# Patient Record
Sex: Male | Born: 1975 | Race: Black or African American | Hispanic: No | Marital: Married | Smoking: Current every day smoker
Health system: Southern US, Community
[De-identification: ages and names within clinical notes are randomized; demographics above are authoritative.]

## PROBLEM LIST (undated history)

## (undated) DIAGNOSIS — F10988 Alcohol use, unspecified with other alcohol-induced disorder: Secondary | ICD-10-CM

## (undated) DIAGNOSIS — F10929 Alcohol use, unspecified with intoxication, unspecified: Secondary | ICD-10-CM

---

## 2004-02-19 ENCOUNTER — Emergency Department (HOSPITAL_COMMUNITY): Admission: EM | Admit: 2004-02-19 | Discharge: 2004-02-19 | Payer: Self-pay | Admitting: Family Medicine

## 2004-05-14 ENCOUNTER — Ambulatory Visit: Payer: Self-pay | Admitting: Internal Medicine

## 2012-06-11 ENCOUNTER — Emergency Department (HOSPITAL_COMMUNITY)
Admission: EM | Admit: 2012-06-11 | Discharge: 2012-06-11 | Discharge status: Absent without leave | Payer: Medicaid Other | Source: Home / Self Care

## 2012-06-11 ENCOUNTER — Emergency Department (INDEPENDENT_AMBULATORY_CARE_PROVIDER_SITE_OTHER)
Admission: EM | Admit: 2012-06-11 | Discharge: 2012-06-11 | Disposition: A | Discharge status: Home / Self Care | Payer: Medicare Other | Source: Home / Self Care | Attending: Emergency Medicine | Admitting: Emergency Medicine

## 2012-06-11 ENCOUNTER — Encounter (HOSPITAL_COMMUNITY): Payer: Self-pay

## 2012-06-11 ENCOUNTER — Emergency Department (INDEPENDENT_AMBULATORY_CARE_PROVIDER_SITE_OTHER): Payer: Medicaid Other

## 2012-06-11 DIAGNOSIS — Z23 Encounter for immunization: Secondary | ICD-10-CM

## 2012-06-11 DIAGNOSIS — S6992XA Unspecified injury of left wrist, hand and finger(s), initial encounter: Secondary | ICD-10-CM

## 2012-06-11 DIAGNOSIS — S6990XA Unspecified injury of unspecified wrist, hand and finger(s), initial encounter: Secondary | ICD-10-CM

## 2012-06-11 MED ORDER — IBUPROFEN 600 MG PO TABS: 600.0000 mg | ORAL_TABLET | Freq: Two times a day (BID) | ORAL | Status: DC

## 2012-06-11 MED ORDER — TETANUS-DIPHTH-ACELL PERTUSSIS 5-2.5-18.5 LF-MCG/0.5 IM SUSP
0.5000 mL | Freq: Once | INTRAMUSCULAR | Status: AC
  Administered 2012-06-11: 0.5 mL via INTRAMUSCULAR

## 2012-06-11 MED ORDER — IBUPROFEN 600 MG PO TABS
600.0000 mg | ORAL_TABLET | Freq: Once | ORAL | Status: AC
  Administered 2012-06-11: 600 mg via ORAL
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
## 2012-06-12 NOTE — ED Provider Notes (Signed)
Medical screening examination/treatment/procedure(s) were performed by non-physician practitioner and as supervising physician I was immediately available for consultation/collaboration.  Leslee Home, M.D.   Reuben Likes, MD 06/12/12 [phone]

## 2013-07-10 ENCOUNTER — Ambulatory Visit (INDEPENDENT_AMBULATORY_CARE_PROVIDER_SITE_OTHER): Payer: Medicare Other | Admitting: Podiatry

## 2013-07-10 ENCOUNTER — Encounter: Payer: Self-pay | Admitting: Podiatry

## 2013-07-10 VITALS — BP 106/62 | HR 82 | Resp 18 | Ht 68.0 in | Wt 160.0 lb

## 2013-07-10 DIAGNOSIS — B353 Tinea pedis: Secondary | ICD-10-CM

## 2013-07-10 DIAGNOSIS — B351 Tinea unguium: Secondary | ICD-10-CM

## 2013-07-10 MED ORDER — NYSTATIN 100000 UNIT/GM EX CREA: 1.0000 "application " | TOPICAL_CREAM | Freq: Two times a day (BID) | CUTANEOUS | Status: AC

## 2013-07-10 NOTE — Progress Notes (Signed)
Daniel Beck presents today with a care provider with a chief complaint of painful elongated toenails and itching and burning between his toes.  Objective: Pulses are palpable bilateral. Wet maceration between the toes is noted bilateral. Nails are thick yellow dystrophic onychomycotic.  Assessment: Pain in limb secondary to onychomycosis. Interdigital tinea pedis bilateral foot.  Plan: Nystatin cream was written today to be applied on a twice a day basis. Debridement nails 1 through 5 bilateral is cover service secondary to pain. Followup with him in 3 months.

## 2013-07-14 ENCOUNTER — Encounter (HOSPITAL_COMMUNITY): Payer: Self-pay | Admitting: Emergency Medicine

## 2013-07-14 ENCOUNTER — Emergency Department (HOSPITAL_COMMUNITY)
Admission: EM | Admit: 2013-07-14 | Discharge: 2013-07-14 | Discharge status: Absent without leave | Payer: Medicare Other | Source: Home / Self Care

## 2013-07-14 ENCOUNTER — Emergency Department (HOSPITAL_COMMUNITY): Payer: Medicare Other

## 2013-07-14 ENCOUNTER — Emergency Department (HOSPITAL_COMMUNITY)
Admission: EM | Admit: 2013-07-14 | Discharge: 2013-07-14 | Disposition: A | Discharge status: Home / Self Care | Payer: Medicare Other | Attending: Emergency Medicine | Admitting: Emergency Medicine

## 2013-07-14 DIAGNOSIS — Z792 Long term (current) use of antibiotics: Secondary | ICD-10-CM | POA: Insufficient documentation

## 2013-07-14 DIAGNOSIS — S6390XA Sprain of unspecified part of unspecified wrist and hand, initial encounter: Secondary | ICD-10-CM | POA: Insufficient documentation

## 2013-07-14 DIAGNOSIS — F17200 Nicotine dependence, unspecified, uncomplicated: Secondary | ICD-10-CM | POA: Insufficient documentation

## 2013-07-14 DIAGNOSIS — Y939 Activity, unspecified: Secondary | ICD-10-CM | POA: Insufficient documentation

## 2013-07-14 DIAGNOSIS — Y929 Unspecified place or not applicable: Secondary | ICD-10-CM | POA: Insufficient documentation

## 2013-07-14 DIAGNOSIS — IMO0002 Reserved for concepts with insufficient information to code with codable children: Secondary | ICD-10-CM | POA: Insufficient documentation

## 2013-07-14 DIAGNOSIS — Z79899 Other long term (current) drug therapy: Secondary | ICD-10-CM | POA: Insufficient documentation
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
## 2013-07-15 NOTE — ED Provider Notes (Signed)
Medical screening examination/treatment/procedure(s) were performed by non-physician practitioner and as supervising physician I was immediately available for consultation/collaboration.  EKG Interpretation   None         Jakyla Reza, MD 07/15/13 0139 

## 2013-10-09 ENCOUNTER — Encounter: Payer: Self-pay | Admitting: Podiatry

## 2013-10-09 ENCOUNTER — Ambulatory Visit (INDEPENDENT_AMBULATORY_CARE_PROVIDER_SITE_OTHER): Payer: Medicare Other | Admitting: Podiatry

## 2013-10-09 VITALS — BP 117/74 | HR 68 | Resp 16

## 2013-10-09 DIAGNOSIS — M79609 Pain in unspecified limb: Secondary | ICD-10-CM

## 2013-10-09 DIAGNOSIS — B351 Tinea unguium: Secondary | ICD-10-CM

## 2013-10-09 NOTE — Progress Notes (Signed)
Pt states just following up of his toenails. His nails are thick yellow dystrophic onychomycotic and painful he says.  Objective: Pulses are palpable bilateral nails are thick yellow dystrophic with mycotic and painful palpation.  Assessment: Pain in limb secondary to onychomycosis.  Plan: Debridement of nails 1 through 5 bilateral.

## 2014-01-08 ENCOUNTER — Ambulatory Visit (INDEPENDENT_AMBULATORY_CARE_PROVIDER_SITE_OTHER): Payer: Medicare Other | Admitting: Podiatry

## 2014-01-08 ENCOUNTER — Encounter: Payer: Self-pay | Admitting: Podiatry

## 2014-01-08 DIAGNOSIS — M79609 Pain in unspecified limb: Secondary | ICD-10-CM

## 2014-01-08 DIAGNOSIS — B351 Tinea unguium: Secondary | ICD-10-CM

## 2014-01-08 MED ORDER — CLOTRIMAZOLE 1 % EX CREA: 1.0000 "application " | TOPICAL_CREAM | Freq: Two times a day (BID) | CUTANEOUS | Status: AC

## 2014-01-08 NOTE — Progress Notes (Signed)
He presents today with one of his caregivers with a followup of painful toenails and maceration between his toes. He continues apply the Lotrimin cream on an daily to twice a day basis.  Objective: Nails are thick yellow dystrophic onychomycotic and painful palpation. Maceration still exists between the toes but appears to be under control. There is no signs of cellulitis or any bacterial infection.  Assessment: Interdigital tinea pedis bilateral with pain in limb secondary to onychomycosis bilateral.  Plan: Debridement of all reactive hyperkeratotic lesions debridement of nails 1 through 5 bilateral is cover service and refill the Lotrimin cream

## 2014-01-10 ENCOUNTER — Telehealth: Payer: Self-pay | Admitting: *Deleted

## 2014-01-10 MED ORDER — CLINDAMYCIN PHOSPHATE 1 % EX SOLN: Freq: Two times a day (BID) | CUTANEOUS | Status: AC

## 2014-01-10 NOTE — Telephone Encounter (Signed)
Calling in reference to Daniel Beck.  He saw Dr. Al CorpusHyatt on the 5th.  He called in a cream for his feet.  It was discontinued from his MAR.  He was on a liquid.  Pleased call be back so I can tell you what this liquid is so you can put in an order for it.  I returned her call.  She asked if we could call in Clindamycin 1% solution.  [facility]  I told her I would call back with a response.

## 2014-01-10 NOTE — Telephone Encounter (Signed)
That will be fine.   Thank you

## 2014-01-10 NOTE — Telephone Encounter (Signed)
I attempted to call and inform them we e-scribed the Clindamycin to [facility].  There was no answer.

## 2014-03-15 IMAGING — CR DG HAND COMPLETE 3+V*R*
3 series · 3 of 3 positions shown · non-contrast
Comparison: None.

CLINICAL DATA: Right hand pain and swelling

EXAM:
RIGHT HAND - COMPLETE 3+ VIEW

[x hand pa right]
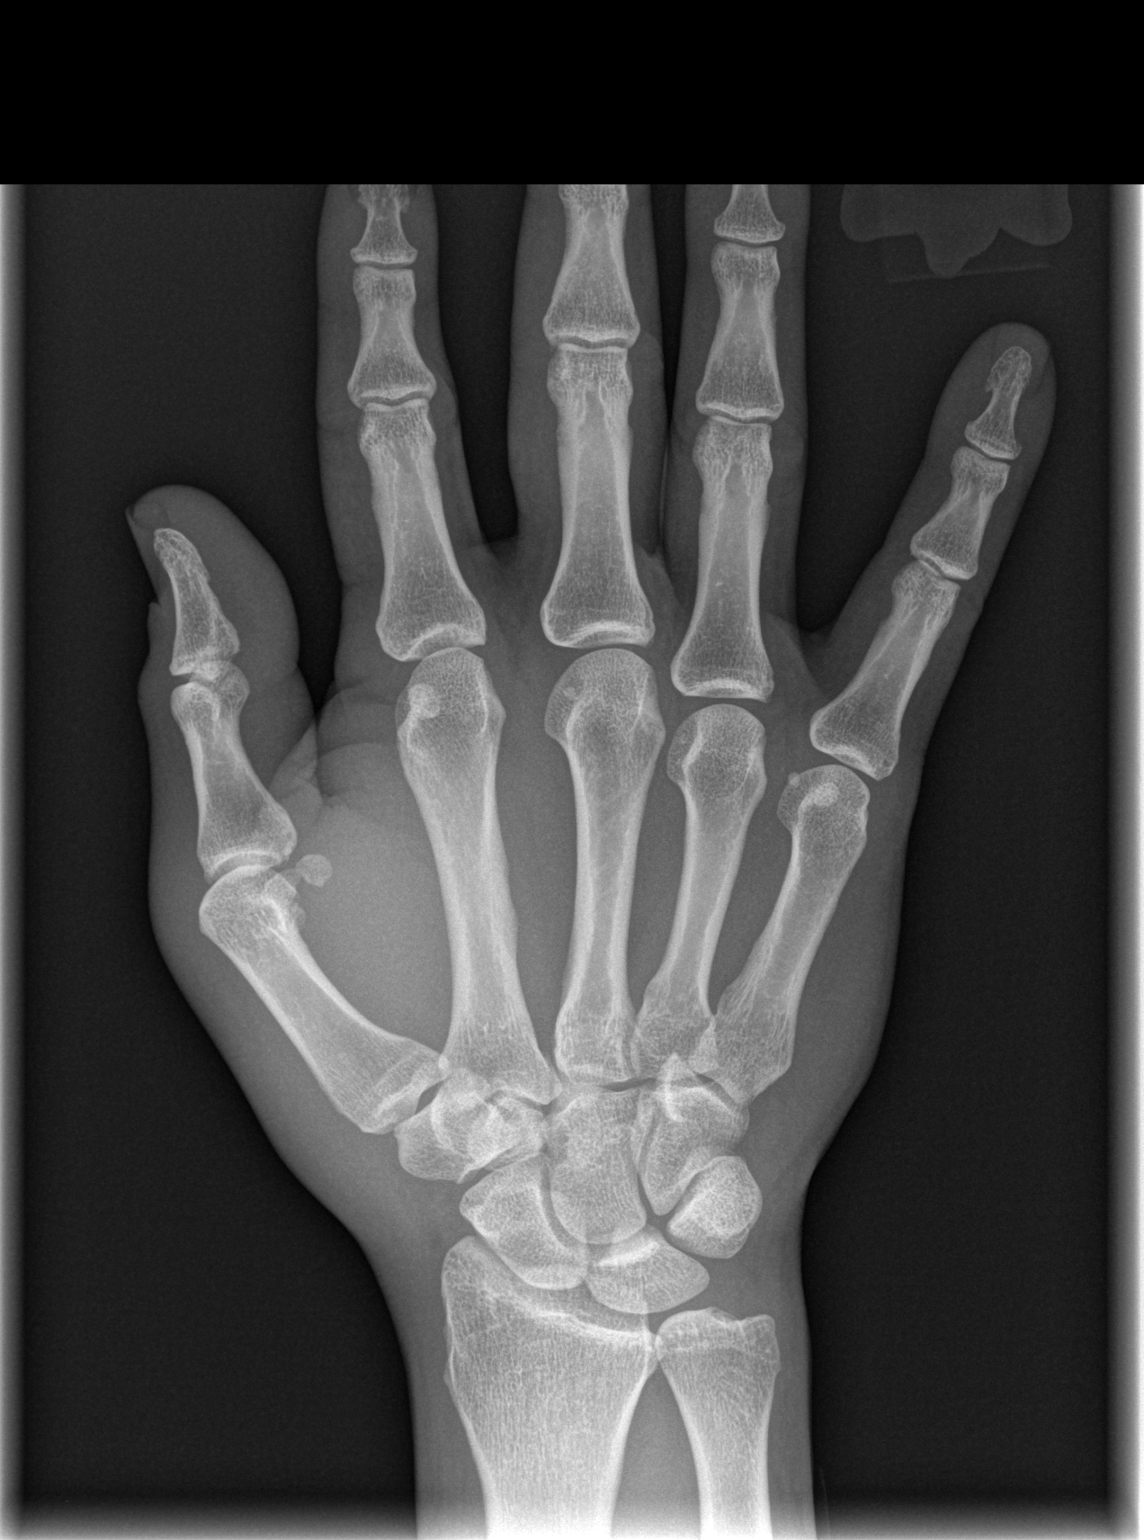

[x hand oblique right]
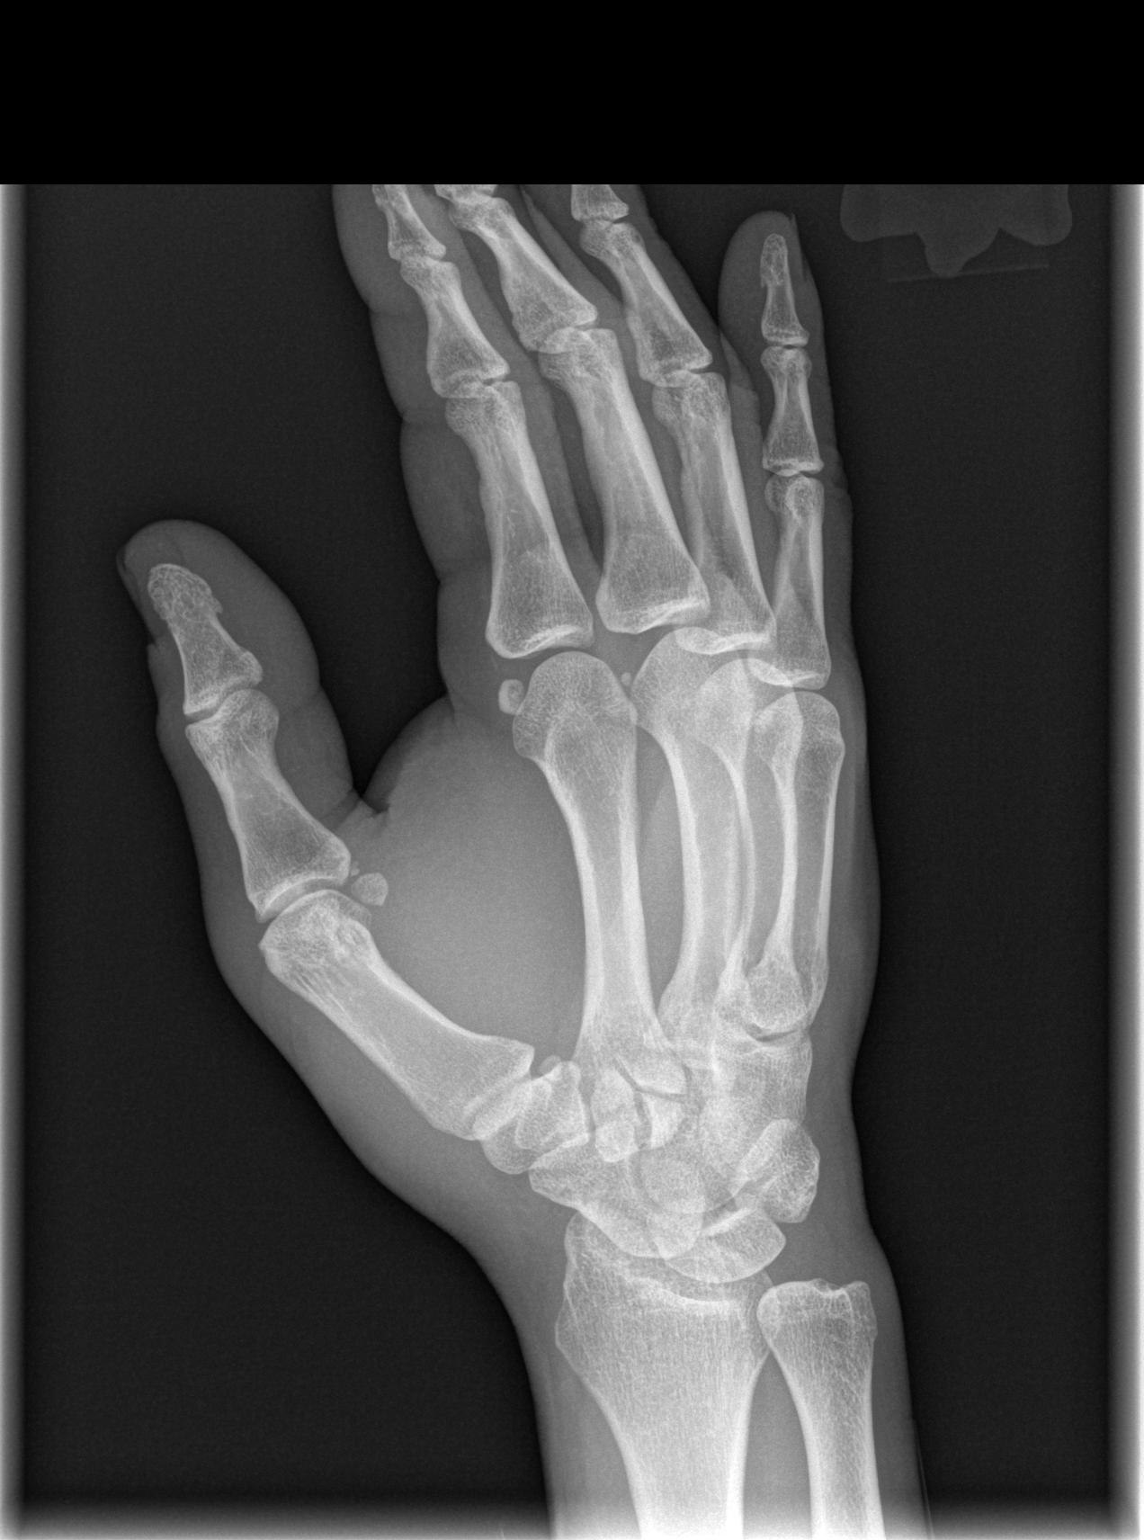

[x hand lat right]
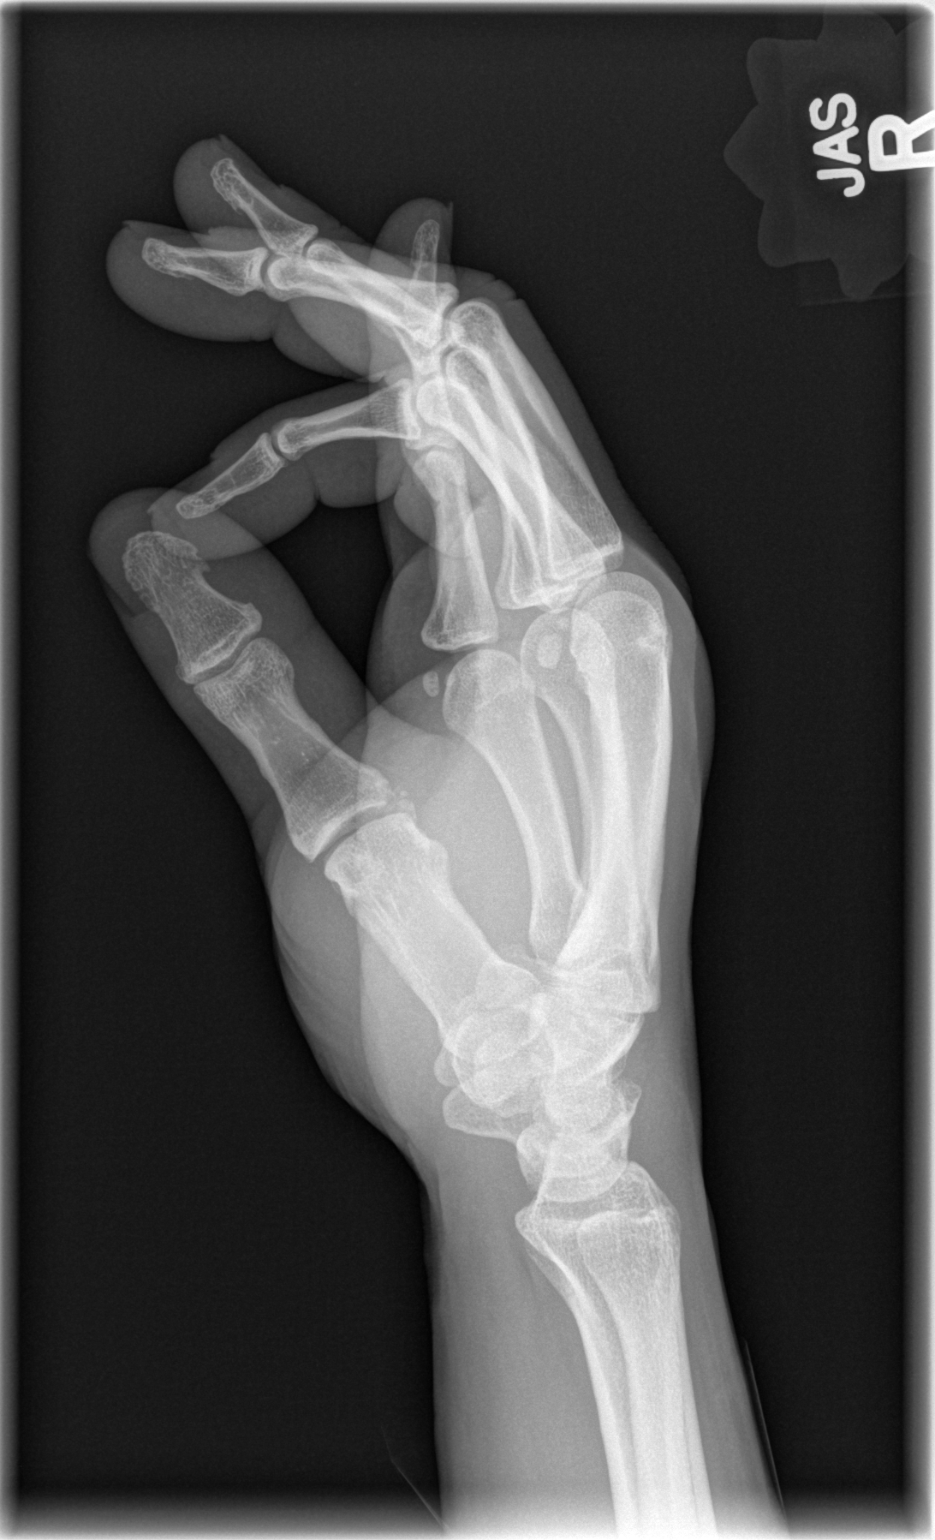

[3 of 3 positions shown; findings below may reference images not displayed]

FINDINGS: Three views of right hand submitted. No acute fracture or
subluxation. No radiopaque foreign body. No periosteal reaction or
bony erosion.
IMPRESSION: Negative.

## 2014-04-16 ENCOUNTER — Ambulatory Visit (INDEPENDENT_AMBULATORY_CARE_PROVIDER_SITE_OTHER): Payer: Medicare Other | Admitting: Podiatry

## 2014-04-16 ENCOUNTER — Encounter: Payer: Self-pay | Admitting: Podiatry

## 2014-04-16 DIAGNOSIS — M79609 Pain in unspecified limb: Secondary | ICD-10-CM

## 2014-04-16 DIAGNOSIS — M79676 Pain in unspecified toe(s): Secondary | ICD-10-CM

## 2014-04-16 DIAGNOSIS — B351 Tinea unguium: Secondary | ICD-10-CM

## 2014-04-16 NOTE — Progress Notes (Signed)
He presents today chief complaint of painful elongated toenails.  Objective: Nails are thick yellow dystrophic with mycotic and painful palpation. Tinea pedis is interdigital.  Assessment: Interdigital tinea pedis with nails thick yellow dystrophic onychomycotic resulting in chronic pain.  Plan: Debridement of painful nails 1 through 5 bilateral. Continued from is all. 

## 2014-07-23 ENCOUNTER — Ambulatory Visit: Payer: Medicare Other | Admitting: Podiatry

## 2014-07-25 ENCOUNTER — Ambulatory Visit (INDEPENDENT_AMBULATORY_CARE_PROVIDER_SITE_OTHER): Payer: Medicare Other | Admitting: Podiatry

## 2014-07-25 DIAGNOSIS — B351 Tinea unguium: Secondary | ICD-10-CM

## 2014-07-25 DIAGNOSIS — M79673 Pain in unspecified foot: Secondary | ICD-10-CM

## 2014-07-25 NOTE — Progress Notes (Signed)
He presents today chief complaint of painful elongated toenails.  Objective: Nails are thick yellow dystrophic with mycotic and painful palpation. Tinea pedis is interdigital.  Assessment: Interdigital tinea pedis with nails thick yellow dystrophic onychomycotic resulting in chronic pain.  Plan: Debridement of painful nails 1 through 5 bilateral. Continued from is all.

## 2014-10-31 ENCOUNTER — Ambulatory Visit: Payer: Medicare Other

## 2015-01-02 ENCOUNTER — Ambulatory Visit: Payer: Medicare Other | Admitting: Podiatry

## 2015-01-07 ENCOUNTER — Encounter: Payer: Self-pay | Admitting: Podiatry

## 2015-01-07 ENCOUNTER — Ambulatory Visit (INDEPENDENT_AMBULATORY_CARE_PROVIDER_SITE_OTHER): Payer: Medicare Other | Admitting: Podiatry

## 2015-01-07 DIAGNOSIS — M79676 Pain in unspecified toe(s): Secondary | ICD-10-CM | POA: Diagnosis not present

## 2015-01-07 DIAGNOSIS — B351 Tinea unguium: Secondary | ICD-10-CM | POA: Diagnosis not present

## 2015-01-07 NOTE — Progress Notes (Signed)
He presents today with chief complaint of painful elongated toenails bilaterally. Also following up for interdigital bacterial infection.  Objective: Vital signs are stable he is alert and oriented 3 pulses are palpable bilateral. His nails are thick yellow dystrophic mycotic and painful palpation. Interdigital bacterial infection appears to be resolving quite nicely with the use of clindamycin topical twice daily.  Assessment: Pain in limb secondary to onychomycosis 1 through 5 bilateral. Interdigital bacterial infection bilateral.  Plan: Decreased clindamycin to once a day. I also encouraged him to dry well between his toes. I also debrided nails 1 through 5 bilateral service secondary to pain. Follow-up with him in 3 months.

## 2015-03-04 ENCOUNTER — Telehealth: Payer: Self-pay | Admitting: *Deleted

## 2015-03-04 NOTE — Telephone Encounter (Signed)
Calls from [facility] and [facility] [phone] Daniel Beck states pt is refusing to use the Clindamycin Solution stating he doesn't need and the facility needs a doctor order to D/C.    Dr. Al CorpusHyatt ordered stop Clindamycin solution at this time. Faxed to [phone] (number rings to fax).

## 2015-04-08 ENCOUNTER — Ambulatory Visit: Payer: Medicare Other

## 2015-04-15 ENCOUNTER — Ambulatory Visit: Payer: Medicare Other | Admitting: Podiatry

## 2015-07-29 ENCOUNTER — Ambulatory Visit: Payer: Medicare Other | Admitting: Podiatry

## 2016-03-02 ENCOUNTER — Ambulatory Visit (HOSPITAL_COMMUNITY)
Admission: EM | Admit: 2016-03-02 | Discharge: 2016-03-02 | Disposition: A | Discharge status: Home / Self Care | Payer: Medicare Other | Attending: Family Medicine | Admitting: Family Medicine

## 2016-03-02 ENCOUNTER — Encounter (HOSPITAL_COMMUNITY): Payer: Self-pay | Admitting: Emergency Medicine

## 2016-03-02 DIAGNOSIS — L255 Unspecified contact dermatitis due to plants, except food: Secondary | ICD-10-CM | POA: Diagnosis not present

## 2016-03-02 HISTORY — DX: Alcohol use, unspecified with other alcohol-induced disorder: F10.988

## 2016-03-02 HISTORY — DX: Alcohol use, unspecified with intoxication, unspecified: F10.929

## 2016-03-02 MED ORDER — TRIAMCINOLONE ACETONIDE 0.1 % EX CREA: 1.0000 "application " | TOPICAL_CREAM | Freq: Two times a day (BID) | CUTANEOUS | Status: AC

## 2016-03-02 MED ORDER — PREDNISONE 20 MG PO TABS: ORAL_TABLET | ORAL | Status: AC

## 2016-03-02 NOTE — ED Provider Notes (Signed)
CSN: [id number]     Arrival date & time 03/02/16  1446 History   First MD Initiated Contact with Patient 03/02/16 1508     Chief Complaint  Patient presents with  . Rash   (Consider location/radiation/quality/duration/timing/severity/associated sxs/prior Treatment) HPI Comments: 40 year old male presents with a itchy rash on bilateral forearms. States he has been outside in the weeds. This started approximately 2 days ago. No other areas currently affected.  Patient is a 40 y.o. male presenting with rash.  Rash Associated symptoms: no fatigue and no fever     Past Medical History  Diagnosis Date  . 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 No pertinent past surgical history. History reviewed. No pertinent family history. Social History  Substance Use Topics  . Smoking status: Current Every Day Smoker -- 1.00 packs/day    Types: Cigarettes  . Smokeless tobacco: None  . Alcohol Use: Yes     Comment: social drinker     Review of Systems  Constitutional: Negative for fever, activity change and fatigue.  HENT: Negative.   Respiratory: Negative.   Skin: Positive for rash.  Neurological: Negative.   All other systems reviewed and are negative.   Allergies  Review of patient's allergies indicates no known allergies.  Home Medications   Prior to Admission medications   Medication Sig Start Date End Date Taking? Authorizing Provider  benztropine (COGENTIN) 1 MG tablet Take 1.5 mg by mouth 2 (two) times daily.    Yes Historical Provider, MD  folic acid (FOLVITE) 1 MG tablet Take 1 mg by mouth daily.   Yes Historical Provider, MD  lurasidone (LATUDA) 80 MG TABS tablet Take 80 mg by mouth daily with breakfast.   Yes Historical Provider, MD  propranolol (INDERAL) 10 MG tablet Take 10 mg by mouth 3 (three) times daily.    Yes Historical Provider, MD  thiamine (VITAMIN B-1) 100 MG tablet Take 100 mg by mouth daily.   Yes  Historical Provider, MD  ALPRAZolam Prudy Feeler(XANAX) 0.5 MG tablet Take 0.5 mg by mouth 2 (two) times daily.    Historical Provider, MD  clindamycin (CLEOCIN T) 1 % external solution Apply 1 application topically 2 (two) times daily.    Historical Provider, MD  clindamycin (CLEOCIN-T) 1 % external solution Apply topically 2 (two) times daily. 01/10/14   Max T Hyatt, DPM  clonazePAM (KLONOPIN) 0.5 MG tablet  03/29/14   Historical Provider, MD  clotrimazole (LOTRIMIN) 1 % cream Apply 1 application topically 2 (two) times daily. 01/08/14   Max T Hyatt, DPM  diclofenac (VOLTAREN) 75 MG EC tablet Take 75 mg by mouth 2 (two) times daily as needed for mild pain.    Historical Provider, MD  diphenhydrAMINE (BENADRYL) 25 mg capsule Take 25 mg by mouth every 4 (four) hours as needed for allergies.    Historical Provider, MD  haloperidol (HALDOL) 1 MG tablet Take 1 mg by mouth 2 (two) times daily.    Historical Provider, MD  ibuprofen (ADVIL,MOTRIN) 800 MG tablet Take 800 mg by mouth 2 (two) times daily as needed for moderate pain.    Historical Provider, MD  Loratadine 10 MG CAPS Take 10 mg by mouth daily.     Historical Provider, MD  nystatin cream (MYCOSTATIN) Apply 1 application topically 2 (two) times daily. Apply to the affected area twice daily 07/10/13   Max T Hyatt, DPM  omeprazole (PRILOSEC) 20 MG capsule Take 20 mg by mouth daily.  Historical Provider, MD  polyethylene glycol (MIRALAX / GLYCOLAX) packet Take 17 g by mouth daily.    Historical Provider, MD  predniSONE (DELTASONE) 20 MG tablet Take 3 tabs po on first day, 2 tabs second day, 2 tabs third day, 1 tab fourth day, 1 tab 5th day. Take with food. 03/02/16   Hayden Rasmussenavid Danaya Geddis, NP  Probiotic Product (ALIGN) 4 MG CAPS Take 1 capsule by mouth 2 (two) times daily.    Historical Provider, MD  triamcinolone cream (KENALOG) 0.1 % Apply 1 application topically 2 (two) times daily. 03/02/16   Hayden Rasmussenavid Aunisty Reali, NP  trihexyphenidyl (ARTANE) 2 MG tablet Take 2 mg by mouth at  bedtime.    Historical Provider, MD   Meds Ordered and Administered this Visit  Medications - No data to display  BP 144/95 mmHg  Pulse 90  Temp(Src) 97.9 F (36.6 C) (Oral)  Resp 16  SpO2 98% No data found.   Physical Exam  Constitutional: He is oriented to person, place, and time. He appears well-developed and well-nourished. No distress.  Eyes: EOM are normal.  No facial lesions  Neck: Normal range of motion. Neck supple.  Cardiovascular: Normal rate.   Pulmonary/Chest: Effort normal. No respiratory distress.  Musculoskeletal: He exhibits no edema.  Neurological: He is alert and oriented to person, place, and time. He exhibits normal muscle tone.  Skin: Skin is warm and dry.  Bilateral forearms with multiple papular vesicular lesions several isolated lesions others in a straight line. The rash is consistent with exposure to poison ivy. No evidence of cellulitis or secondary infection.  Psychiatric: He has a normal mood and affect.  Nursing note and vitals reviewed.   ED Course  Procedures (including critical care time)  Labs Review Labs Reviewed - No data to display  Imaging Review No results found.   Visual Acuity Review  Right Eye Distance:   Left Eye Distance:   Bilateral Distance:    Right Eye Near:   Left Eye Near:    Bilateral Near:         MDM   1. Rhus dermatitis    Meds ordered this encounter  Medications  . triamcinolone cream (KENALOG) 0.1 %    Sig: Apply 1 application topically 2 (two) times daily.    Dispense:  30 g    Refill:  0    Order Specific Question:  Supervising Provider    Answer:  Linna HoffKINDL, JAMES D [phone][[id number]]  . predniSONE (DELTASONE) 20 MG tablet    Sig: Take 3 tabs po on first day, 2 tabs second day, 2 tabs third day, 1 tab fourth day, 1 tab 5th day. Take with food.    Dispense:  9 tablet    Refill:  0    Order Specific Question:  Supervising Provider    Answer:  Linna HoffKINDL, JAMES D [[id number]]   May also use Caladryl lotion but do  not apply within 1 hour of application of the triamcinolone cream.    Hayden Rasmussenavid Sahir Tolson, NP 03/02/16 1543

## 2016-03-02 NOTE — ED Notes (Signed)
[facility] a [facility] of a rash on his arms and chest x 2 days that he believed to be poison ivy.

## 2016-03-02 NOTE — Discharge Instructions (Signed)
Poison Ivy Poison ivy is a rash caused by touching the leaves of the poison ivy plant. The rash often shows up 48 hours later. You might just have bumps, redness, and itching. Sometimes, blisters appear and break open. Your eyes may get puffy (swollen). Poison ivy often heals in 2 to 3 weeks without treatment. HOME CARE  If you touch poison ivy:  Wash your skin with soap and water right away. Wash under your fingernails. Do not rub the skin very hard.  Wash any clothes you were wearing.  Avoid poison ivy in the future. Poison ivy has 3 leaves on a stem.  Use medicine to help with itching as told by your doctor. Do not drive when you take this medicine.  Keep open sores dry, clean, and covered with a bandage and medicated cream, if needed.  Ask your doctor about medicine for children. GET HELP RIGHT AWAY IF:  You have open sores.  Redness spreads beyond the area of the rash.  There is yellowish white fluid (pus) coming from the rash.  Pain gets worse.  You have a temperature by mouth above 102 F (38.9 C), not controlled by medicine. MAKE SURE YOU:  Understand these instructions.  Will watch your condition.  Will get help right away if you are not doing well or get worse.   This information is not intended to replace advice given to you by your health care provider. Make sure you discuss any questions you have with your health care provider.   Document Released: 09/25/2010 Document Revised: 11/15/2011 Document Reviewed: 01/29/2015 Elsevier Interactive Patient Education 2016 [employer]
